# Patient Record
Sex: Female | Born: 1990 | Race: Black or African American | Hispanic: No | Marital: Single | Smoking: Former smoker
Health system: Southern US, Community
[De-identification: ages and names within clinical notes are randomized; demographics above are authoritative.]

## PROBLEM LIST (undated history)

## (undated) DIAGNOSIS — D573 Sickle-cell trait: Secondary | ICD-10-CM

---
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
## 2011-01-31 LAB — WET PREP, GENITAL
Clue Cells Wet Prep HPF POC: NONE SEEN
Yeast Wet Prep HPF POC: NONE SEEN

## 2011-01-31 LAB — POCT URINALYSIS DIP (DEVICE)
Glucose, UA: NEGATIVE
Ketones, ur: NEGATIVE
Specific Gravity, Urine: 1.015

## 2011-01-31 LAB — POCT PREGNANCY, URINE: Preg Test, Ur: NEGATIVE

## 2016-01-18 ENCOUNTER — Emergency Department (HOSPITAL_BASED_OUTPATIENT_CLINIC_OR_DEPARTMENT_OTHER)
Admission: EM | Admit: 2016-01-18 | Discharge: 2016-01-18 | Disposition: A | Discharge status: Home / Self Care | Payer: Self-pay | Attending: Emergency Medicine | Admitting: Emergency Medicine

## 2016-01-18 ENCOUNTER — Encounter (HOSPITAL_BASED_OUTPATIENT_CLINIC_OR_DEPARTMENT_OTHER): Payer: Self-pay | Admitting: Emergency Medicine

## 2016-01-18 DIAGNOSIS — J020 Streptococcal pharyngitis: Secondary | ICD-10-CM | POA: Insufficient documentation

## 2016-01-18 LAB — RAPID STREP SCREEN (MED CTR MEBANE ONLY): STREPTOCOCCUS, GROUP A SCREEN (DIRECT): POSITIVE — AB

## 2016-01-18 MED ORDER — CEPHALEXIN 500 MG PO CAPS: 500.0000 mg | ORAL_CAPSULE | Freq: Three times a day (TID) | ORAL | 0 refills | Status: DC

## 2016-01-18 NOTE — ED Provider Notes (Signed)
[facility]   CSN: [id number] Arrival date & time: 01/18/16  1656  By signing my name below, I, Phillis Haggis, attest that this documentation has been prepared under the direction and in the presence of Felicie Morn, NP-C. Electronically Signed: Phillis Haggis, ED Scribe. 01/18/16. 5:56 PM.  History   Chief Complaint Chief Complaint  Patient presents with  . Sore Throat   The history is provided by the patient. No language interpreter was used.  Sore Throat  This is a new problem. The current episode started 2 days ago. The problem occurs constantly. The problem has been gradually worsening. Treatments tried: Cold medications. The treatment provided no relief.   HPI Comments: Patricia Norton is a 25 y.o. female who presents to the Emergency Department complaining of a gradually worsening sore throat onset 2 days ago. Pt reports associated fever tmax 100 F that has since resolved. She has tried day and night gel cap cold medication for her symptoms to no relief. She denies sick contacts, chills, trouble swallowing, nausea, or vomiting, Pt is allergic to penicillin.   History reviewed. No pertinent past medical history.  There are no active problems to display for this patient.   History reviewed. No pertinent surgical history.  OB History    No data available      Home Medications    Prior to Admission medications   Not on File    Family History History reviewed. No pertinent family history.  Social History Social History  Substance Use Topics  . Smoking status: Never Smoker  . Smokeless tobacco: Never Used  . Alcohol use No     Allergies   Penicillins   Review of Systems Review of Systems  Constitutional: Positive for fever. Negative for chills.  HENT: Positive for sore throat. Negative for trouble swallowing.   Gastrointestinal: Negative for nausea and vomiting.  All other systems reviewed and are negative.    Physical  Exam Updated Vital Signs BP 119/77   Pulse 102   Temp 99 F (37.2 C) (Oral)   Resp 18   Ht 5\' 3"  (1.6 m)   Wt 155 lb (70.3 kg)   LMP 01/08/2016   SpO2 98%   BMI 27.46 kg/m   Physical Exam  Constitutional: She is oriented to person, place, and time. She appears well-developed and well-nourished.  HENT:  Head: Normocephalic and atraumatic.  Mouth/Throat: Uvula is midline and mucous membranes are normal. Oropharyngeal exudate and posterior oropharyngeal erythema present.  Eyes: Conjunctivae and EOM are normal. Pupils are equal, round, and reactive to light.  Neck: Normal range of motion. Neck supple.  Cardiovascular: Normal rate and regular rhythm.   Pulmonary/Chest: Effort normal.  Musculoskeletal: Normal range of motion.  Neurological: She is alert and oriented to person, place, and time.  Skin: Skin is warm and dry.  Psychiatric: She has a normal mood and affect. Her behavior is normal.  Nursing note and vitals reviewed.    ED Treatments / Results  DIAGNOSTIC STUDIES: Oxygen Saturation is 98% on RA, normal by my interpretation.    COORDINATION OF CARE: 5:54 PM-Discussed treatment plan which includes strep screen and Keflex with pt at bedside and pt agreed to plan.    Labs (all labs ordered are listed, but only abnormal results are displayed) Labs Reviewed  RAPID STREP SCREEN (NOT AT [facility]) - Abnormal; Notable for the following:       Result Value   Streptococcus, Group A Screen (Direct) POSITIVE (*)  All other components within normal limits    EKG  EKG Interpretation None       Radiology No results found.  Procedures Procedures (including critical care time)  Medications Ordered in ED Medications - No data to display   Initial Impression / Assessment and Plan / ED Course  I have reviewed the triage vital signs and the nursing notes.  Pertinent labs & imaging results that were available during my care of the patient were reviewed by me and  considered in my medical decision making (see chart for details).  Clinical Course      Final Clinical Impressions(s) / ED Diagnoses   Pt afebrile with tonsillar exudate, cervical lymphadenopathy, & dysphagia; diagnosis of strep. Treated in the Ed with PCN IM.  Presentation non concerning for PTA or infxn spread to soft tissue. No trismus or uvula deviation. Specific return precautions discussed. Pt able to drink water in ED without difficulty with intact air way. Recommended PCP follow up.   Final diagnoses:  None  I personally performed the services described in this documentation, which was scribed in my presence. The recorded information has been reviewed and is accurate.   New Prescriptions New Prescriptions   No medications on file     Felicie MornDavid Zynasia Burklow, NP 01/19/16 0145    Rolan BuccoMelanie Belfi, MD 01/20/16 0700

## 2016-01-18 NOTE — ED Triage Notes (Signed)
Sore throat x 2 days

## 2016-07-29 ENCOUNTER — Encounter (HOSPITAL_BASED_OUTPATIENT_CLINIC_OR_DEPARTMENT_OTHER): Payer: Self-pay

## 2016-07-29 ENCOUNTER — Emergency Department (HOSPITAL_BASED_OUTPATIENT_CLINIC_OR_DEPARTMENT_OTHER)
Admission: EM | Admit: 2016-07-29 | Discharge: 2016-07-29 | Disposition: A | Discharge status: Home / Self Care | Payer: BLUE CROSS/BLUE SHIELD | Attending: Emergency Medicine | Admitting: Emergency Medicine

## 2016-07-29 DIAGNOSIS — Z87891 Personal history of nicotine dependence: Secondary | ICD-10-CM | POA: Insufficient documentation

## 2016-07-29 DIAGNOSIS — F1290 Cannabis use, unspecified, uncomplicated: Secondary | ICD-10-CM | POA: Diagnosis not present

## 2016-07-29 DIAGNOSIS — A599 Trichomoniasis, unspecified: Secondary | ICD-10-CM | POA: Insufficient documentation

## 2016-07-29 DIAGNOSIS — R55 Syncope and collapse: Secondary | ICD-10-CM

## 2016-07-29 DIAGNOSIS — N898 Other specified noninflammatory disorders of vagina: Secondary | ICD-10-CM | POA: Diagnosis present

## 2016-07-29 HISTORY — DX: Sickle-cell trait: D57.3

## 2016-07-29 LAB — URINALYSIS, MICROSCOPIC (REFLEX)

## 2016-07-29 LAB — URINALYSIS, ROUTINE W REFLEX MICROSCOPIC
BILIRUBIN URINE: NEGATIVE
Glucose, UA: NEGATIVE mg/dL
Ketones, ur: NEGATIVE mg/dL
NITRITE: NEGATIVE
PROTEIN: NEGATIVE mg/dL
Specific Gravity, Urine: 1.014 (ref 1.005–1.030)
pH: 6 (ref 5.0–8.0)

## 2016-07-29 LAB — PREGNANCY, URINE: PREG TEST UR: NEGATIVE

## 2016-07-29 MED ORDER — METRONIDAZOLE 500 MG PO TABS
2000.0000 mg | ORAL_TABLET | Freq: Once | ORAL | Status: AC
  Administered 2016-07-29: 2000 mg via ORAL
  Filled 2016-07-29: qty 4

## 2016-07-29 MED ORDER — CEFTRIAXONE SODIUM 250 MG IJ SOLR
250.0000 mg | Freq: Once | INTRAMUSCULAR | Status: AC
  Administered 2016-07-29: 250 mg via INTRAMUSCULAR
  Filled 2016-07-29: qty 250

## 2016-07-29 MED ORDER — ONDANSETRON 4 MG PO TBDP
4.0000 mg | ORAL_TABLET | Freq: Once | ORAL | Status: AC
  Administered 2016-07-29: 4 mg via ORAL
  Filled 2016-07-29: qty 1

## 2016-07-29 MED ORDER — AZITHROMYCIN 250 MG PO TABS
1000.0000 mg | ORAL_TABLET | Freq: Once | ORAL | Status: AC
  Administered 2016-07-29: 1000 mg via ORAL
  Filled 2016-07-29: qty 4

## 2016-07-29 NOTE — ED Provider Notes (Signed)
[facility]   CSN: [id number] Arrival date & time: 07/29/16  1748  By signing my name below, I, Patricia Norton, attest that this documentation has been prepared under the direction and in the presence of [employer], PA-C. Electronically Signed: Linna Norton, Scribe. 07/29/2016. 7:55 PM.  History   Chief Complaint Chief Complaint  Patient presents with  . Dizziness    The history is provided by the patient. No language interpreter was used.     HPI Comments: Patricia Norton is a 26 y.o. female with PMHx of sickle cell trait who presents to the Emergency Department complaining of intermittent dizziness and lightheadedness beginning earlier today. She states she became "hot", dizzy and lightheaded at work this afternoon and felt better after sitting down and cooling off; she notes she went home after work, went to sleep, and was lightheaded/dizzy upon waking. Pt notes she has experienced similar symptoms intermittently for several years. She states she has been working a lot recently and not sleeping adequately. She has been eating and drinking normally. Pt reports she has also had some recent vaginal discharge. No h/o anemia. No FMHx of heart problems. She denies syncope, abdominal pain, hematochezia, heavy menstrual bleeding, chest pain, SOB, nausea, vomiting, or any other associated symptoms.  Past Medical History:  Diagnosis Date  . Sickle cell trait (HCC)     There are no active problems to display for this patient.   History reviewed. No pertinent surgical history.  OB History    No data available       Home Medications    Prior to Admission medications   Not on File    Family History No family history on file.  Social History Social History  Substance Use Topics  . Smoking status: Former Games developer  . Smokeless tobacco: Never Used  . Alcohol use Yes     Comment: occ     Allergies   Penicillins   Review of Systems Review of  Systems  Constitutional: Positive for fatigue. Negative for fever.  HENT: Negative for rhinorrhea and sore throat.   Eyes: Negative for redness.  Respiratory: Negative for cough and shortness of breath.   Cardiovascular: Negative for chest pain.  Gastrointestinal: Negative for abdominal pain, blood in stool, diarrhea, nausea and vomiting.  Genitourinary: Positive for vaginal discharge. Negative for dysuria.  Musculoskeletal: Negative for myalgias.  Skin: Negative for rash.  Neurological: Positive for dizziness and light-headedness (near-syncope). Negative for syncope and headaches.   Physical Exam Updated Vital Signs BP 116/65 (BP Location: Right Arm)   Pulse 89   Temp 98.7 F (37.1 C) (Oral)   Resp 16   Ht 5\' 5"  (1.651 m)   Wt 138 lb (62.6 kg)   LMP 07/28/2016   SpO2 100%   BMI 22.96 kg/m   Physical Exam  Constitutional: She is oriented to person, place, and time. She appears well-developed and well-nourished. No distress.  HENT:  Head: Normocephalic and atraumatic.  Mouth/Throat: Oropharynx is clear and moist.  Eyes: Conjunctivae and EOM are normal.  No conjunctival pallor  Neck: Neck supple. No tracheal deviation present.  Cardiovascular: Normal rate.   Pulmonary/Chest: Effort normal. No respiratory distress.  Musculoskeletal: Normal range of motion.  Neurological: She is alert and oriented to person, place, and time.  Skin: Skin is warm and dry.  Psychiatric: She has a normal mood and affect. Her behavior is normal.  Nursing note and vitals reviewed.   ED Treatments / Results  Labs (  all labs ordered are listed, but only abnormal results are displayed) Labs Reviewed  URINALYSIS, ROUTINE W REFLEX MICROSCOPIC - Abnormal; Notable for the following:       Result Value   APPearance CLOUDY (*)    Hgb urine dipstick TRACE (*)    Leukocytes, UA LARGE (*)    All other components within normal limits  URINALYSIS, MICROSCOPIC (REFLEX) - Abnormal; Notable for the  following:    Bacteria, UA FEW (*)    Squamous Epithelial / LPF 6-30 (*)    All other components within normal limits  PREGNANCY, URINE  RPR  HIV ANTIBODY (ROUTINE TESTING)    ED ECG REPORT   Date: 07/29/2016  Rate: 63  Rhythm: normal sinus rhythm  QRS Axis: normal  Intervals: normal  ST/T Wave abnormalities: normal  Conduction Disutrbances:none  Narrative Interpretation: No signs of preexcitation, Brugada syndrome, prolonged QT  Old EKG Reviewed: none available  I have personally reviewed the EKG tracing and agree with the computerized printout as noted.  Procedures Procedures (including critical care time)  DIAGNOSTIC STUDIES: Oxygen Saturation is 100% on RA, normal by my interpretation.    COORDINATION OF CARE: 8:03 PM Discussed treatment plan with pt at bedside and pt agreed to plan.  Medications Ordered in ED Medications - No data to display   Initial Impression / Assessment and Plan / ED Course  I have reviewed the triage vital signs and the nursing notes.  Pertinent labs & imaging results that were available during my care of the patient were reviewed by me and considered in my medical decision making (see chart for details).     Vital signs reviewed and are as follows: Vitals:   07/29/16 1808 07/29/16 2022  BP: 116/65 114/71  Pulse: 89 71  Resp: 16 18  Temp: 98.7 F (37.1 C)    Patient updated on results. Encouraged to rest and hydrate well over the next 2 days. Informed that she will be notified with a positive HIV or syphilis test. Patient encouraged to return to the emergency department with worsening symptoms, difficulty breathing, chest pain, if she passes out or has any other concerns. Encouraged PCP follow-up for general physical.  Final Clinical Impressions(s) / ED Diagnoses   Final diagnoses:  Near syncope  Trichomoniasis   Near syncopal episode: Patient is not orthostatic, not pregnant, no signs of significant anemia, normal vitals. EKG  does not show any concerning findings. Patient had a positive prodrome. She has not been sleeping well and has been working a lot. Suspect benign near syncopal episode.  Trichomoniasis: Patient treated for GC and chlamydia in addition to trichomoniasis. Also tested for HIV and syphilis.  New Prescriptions New Prescriptions   No medications on file   I personally performed the services described in this documentation, which was scribed in my presence. The recorded information has been reviewed and is accurate.    Renne CriglerJoshua Reef Achterberg, PA-C 07/29/16 2125    Lavera Guiseana Duo Liu, MD 07/30/16 1225

## 2016-07-29 NOTE — Discharge Instructions (Signed)
Please read and follow all provided instructions.  Your diagnoses today include:  1. Near syncope   2. Trichomoniasis     Tests performed today include:  Urine test - shows trichomonas  EKG - normal  Test for HIV and syphilis - pending, you'll be notified of positive results  Vital signs. See below for your results today.   Medications prescribed:   None  Take any prescribed medications only as directed.  Home care instructions:  Follow any educational materials contained in this packet.  Rest well and hydrate well over the next 48 hours.  BE VERY CAREFUL not to take multiple medicines containing Tylenol (also called acetaminophen). Doing so can lead to an overdose which can damage your liver and cause liver failure and possibly death.   Follow-up instructions: Please follow-up with your primary care provider in the next 1 week for further evaluation of your symptoms.   Return instructions:   Please return to the Emergency Department if you experience worsening symptoms.  Return if you pass out, develop chest pain or worsening shortness of breath   Please return if you have any other emergent concerns.  Additional Information:  Your vital signs today were: BP 114/71    Pulse 71    Temp 98.7 F (37.1 C) (Oral)    Resp 18    Ht 5\' 5"  (1.651 m)    Wt 62.6 kg    LMP 07/28/2016    SpO2 100%    BMI 22.96 kg/m  If your blood pressure (BP) was elevated above 135/85 this visit, please have this repeated by your doctor within one month. --------------

## 2016-07-29 NOTE — ED Notes (Signed)
Delay in EKG due to student PA in room.

## 2016-07-29 NOTE — ED Triage Notes (Signed)
c/o feeling lightheaded "my whole life"-states she came in today because the feeling happened for the first time at work yesterday-NAD-steady gait

## 2016-07-31 LAB — RPR: RPR Ser Ql: NONREACTIVE

## 2016-07-31 LAB — HIV ANTIBODY (ROUTINE TESTING W REFLEX): HIV Screen 4th Generation wRfx: NONREACTIVE

## 2016-09-27 ENCOUNTER — Encounter (HOSPITAL_BASED_OUTPATIENT_CLINIC_OR_DEPARTMENT_OTHER): Payer: Self-pay | Admitting: Physician Assistant

## 2016-09-27 ENCOUNTER — Emergency Department (HOSPITAL_BASED_OUTPATIENT_CLINIC_OR_DEPARTMENT_OTHER)
Admission: EM | Admit: 2016-09-27 | Discharge: 2016-09-27 | Disposition: A | Discharge status: Home / Self Care | Payer: BLUE CROSS/BLUE SHIELD | Attending: Emergency Medicine | Admitting: Emergency Medicine

## 2016-09-27 DIAGNOSIS — Z3202 Encounter for pregnancy test, result negative: Secondary | ICD-10-CM | POA: Diagnosis not present

## 2016-09-27 DIAGNOSIS — R630 Anorexia: Secondary | ICD-10-CM | POA: Diagnosis not present

## 2016-09-27 DIAGNOSIS — N926 Irregular menstruation, unspecified: Secondary | ICD-10-CM

## 2016-09-27 DIAGNOSIS — Z87891 Personal history of nicotine dependence: Secondary | ICD-10-CM | POA: Insufficient documentation

## 2016-09-27 LAB — PREGNANCY, URINE: PREG TEST UR: NEGATIVE

## 2016-09-27 NOTE — ED Notes (Signed)
ED Provider at bedside. 

## 2016-09-27 NOTE — ED Provider Notes (Signed)
[facility]   CSN: [id number] Arrival date & time: 09/27/16  1317     History   Chief Complaint No chief complaint on file.   HPI Patricia Norton is a 26 y.o. female who presents today with a chief complaint of decreased appetite and missed menstral cycle.  She had her last menstrual cycle in March, Says that she is normally regular and that her cycle normally starts at the end of each month. Reports that she has been having unprotected intercourse. She reports that she took a home pregnancy test last week however that it was negative.  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   She additionally reports that she has not been eating well recently, due to decreased appetite. She said that foods don't smell good and that when she smells food it "sits on my stomach wrong."  She reports occasional marijuana use, stating that recently she has been smoking less.  She reports that she is able to eat bland foods such as mashed potatoes, and macaroni.  Denies abdominal pain, nausea, vomiting. Endorses diarrhea 1-2 times per week which she says is her normal.  HPI  Past Medical History:  Diagnosis Date  . Sickle cell trait (HCC)     There are no active problems to display for this patient.   History reviewed. No pertinent surgical history.  OB History    No data available       Home Medications    Prior to Admission medications   Not on File    Family History History reviewed. No pertinent family history.  Social History Social History  Substance Use Topics  . Smoking status: Former Games developermoker  . Smokeless tobacco: Never Used  . Alcohol use Yes     Comment: occ     Allergies   Penicillins   Review of Systems Review of Systems  Constitutional: Positive for appetite change. Negative for activity change, fatigue and fever.  HENT: Negative for congestion,  sinus pressure and sore throat.   Eyes: Negative for visual disturbance.  Respiratory: Positive for cough (Dry cough, "from marajuana"). Negative for choking and chest tightness.   Cardiovascular: Negative for chest pain.  Gastrointestinal: Positive for diarrhea. Negative for abdominal pain, constipation, nausea and vomiting.  Endocrine: Negative for polyuria.  Genitourinary: Positive for menstrual problem. Negative for decreased urine volume, difficulty urinating, dysuria, frequency, hematuria, pelvic pain, urgency, vaginal bleeding, vaginal discharge and vaginal pain.  Musculoskeletal: Negative for arthralgias, back pain, myalgias, neck pain and neck stiffness.  Skin: Negative for color change and rash.  Neurological: Negative for dizziness, light-headedness, numbness and headaches.     Physical Exam Updated Vital Signs BP 114/65 (BP Location: Right Arm)   Pulse 85   Temp 98.4 F (36.9 C) (Oral)   Resp 17   Ht 5\' 5"  (1.651 m)   Wt 165 lb (74.8 kg)   LMP 08/10/2016   SpO2 98%   BMI 27.46 kg/m   Physical Exam  Constitutional: She appears well-developed and well-nourished.  HENT:  Head: Normocephalic and atraumatic.  Eyes: No scleral icterus.  Neck: Neck supple. No tracheal deviation present.  Cardiovascular: Normal rate, regular rhythm and normal heart sounds.   No murmur heard. Pulmonary/Chest: Effort normal and breath sounds normal. No respiratory distress.  Abdominal: Soft. Bowel sounds are normal. She exhibits no distension and no mass. There is no tenderness. There is  no rebound and no guarding. No hernia.  No pelvic pain  Musculoskeletal: Normal range of motion. She exhibits no deformity.  Lymphadenopathy:    She has no cervical adenopathy.  Neurological: She is alert. Gait normal.  Skin: Skin is warm and dry. No rash noted. She is not diaphoretic. No erythema. No pallor.  Psychiatric: She has a normal mood and affect. Her behavior is normal.  Nursing note and vitals  reviewed.    ED Treatments / Results  Labs (all labs ordered are listed, but only abnormal results are displayed) Labs Reviewed  PREGNANCY, URINE    EKG  EKG Interpretation None       Radiology No results found.  Procedures Procedures (including critical care time)  Medications Ordered in ED Medications - No data to display   Initial Impression / Assessment and Plan / ED Course  I have reviewed the triage vital signs and the nursing notes.  Pertinent labs & imaging results that were available during my care of the patient were reviewed by me and considered in my medical decision making (see chart for details).    Patricia Docker presents with missed menses (LMP end of March) and decreased appetite/nausea.  A urine pregnancy was negative.  She does not take birth control and reports having regular unprotected sex.  She will be advised to follow up with her PCP or OB/GYN with regard to today's complaint.  As she is not nauseous she will not be given anti-emetic. She will be given instructions to take a home pregnancy test next week, to stop smoking marijuana and on safe sex.   At this time there does not appear to be any evidence of an acute emergency medical condition and the patient appears stable for discharge with appropriate outpatient follow up.Diagnosis was discussed with patient who verbalizes understanding and is agreeable to discharge. Pt case discussed with Dr. Eudelia Bunch who agrees with my plan.    Final Clinical Impressions(s) / ED Diagnoses   Final diagnoses:  Urine pregnancy test negative  Decreased appetite  Menstrual period late    New Prescriptions New Prescriptions   No medications on file     Cristina Gong, Cordelia Poche 09/27/16 1427    Cristina Gong, PA-C 09/27/16 1434    Nira Conn, MD 09/27/16 [phone]

## 2016-09-27 NOTE — ED Triage Notes (Signed)
Patient states that she is "very late"  - she noticed about a week ago she stopped having an appetite. She reports that she took a preg test and it was negative

## 2016-09-27 NOTE — Discharge Instructions (Signed)
Today your urine pregnancy test was negative.  You need to obtain a primary care doctor and follow up with them as there are many possible reasons for your symptoms and you may need additional lab work.   Please take another home pregnancy test in one week.  Please only have protected sex.  Having unprotected sex puts you at risk of getting STI(STD) and becoming pregnant.  Please stop smoking marijuana as this may be contributing to your decreased appetite. Please attempt to eat a healthy, balanced diet.

## 2016-11-17 ENCOUNTER — Emergency Department (HOSPITAL_BASED_OUTPATIENT_CLINIC_OR_DEPARTMENT_OTHER)
Admission: EM | Admit: 2016-11-17 | Discharge: 2016-11-17 | Disposition: A | Discharge status: Home / Self Care | Payer: BLUE CROSS/BLUE SHIELD | Attending: Emergency Medicine | Admitting: Emergency Medicine

## 2016-11-17 ENCOUNTER — Encounter (HOSPITAL_BASED_OUTPATIENT_CLINIC_OR_DEPARTMENT_OTHER): Payer: Self-pay | Admitting: *Deleted

## 2016-11-17 DIAGNOSIS — Z87891 Personal history of nicotine dependence: Secondary | ICD-10-CM | POA: Diagnosis not present

## 2016-11-17 DIAGNOSIS — O26891 Other specified pregnancy related conditions, first trimester: Secondary | ICD-10-CM | POA: Insufficient documentation

## 2016-11-17 DIAGNOSIS — R112 Nausea with vomiting, unspecified: Secondary | ICD-10-CM | POA: Diagnosis present

## 2016-11-17 DIAGNOSIS — Z3A01 Less than 8 weeks gestation of pregnancy: Secondary | ICD-10-CM | POA: Diagnosis not present

## 2016-11-17 DIAGNOSIS — O219 Vomiting of pregnancy, unspecified: Secondary | ICD-10-CM | POA: Insufficient documentation

## 2016-11-17 LAB — URINALYSIS, ROUTINE W REFLEX MICROSCOPIC
BILIRUBIN URINE: NEGATIVE
Glucose, UA: NEGATIVE mg/dL
Hgb urine dipstick: NEGATIVE
KETONES UR: 15 mg/dL — AB
Leukocytes, UA: NEGATIVE
NITRITE: NEGATIVE
PROTEIN: NEGATIVE mg/dL
SPECIFIC GRAVITY, URINE: 1.017 (ref 1.005–1.030)
pH: 6 (ref 5.0–8.0)

## 2016-11-17 LAB — PREGNANCY, URINE: PREG TEST UR: POSITIVE — AB

## 2016-11-17 MED ORDER — ONDANSETRON HCL 4 MG/2ML IJ SOLN
4.0000 mg | Freq: Once | INTRAMUSCULAR | Status: AC
  Administered 2016-11-17: 4 mg via INTRAVENOUS
  Filled 2016-11-17: qty 2

## 2016-11-17 MED ORDER — SODIUM CHLORIDE 0.9 % IV BOLUS (SEPSIS)
1000.0000 mL | Freq: Once | INTRAVENOUS | Status: AC
  Administered 2016-11-17: 1000 mL via INTRAVENOUS

## 2016-11-17 MED ORDER — PROMETHAZINE HCL 25 MG PO TABS: 25.0000 mg | ORAL_TABLET | Freq: Four times a day (QID) | ORAL | 0 refills | Status: AC | PRN

## 2016-11-17 MED FILL — PROMETHAZINE 25 MG TABLET: 25 | 3 days supply | Qty: 12 | Fill #0

## 2016-11-17 NOTE — ED Notes (Signed)
ED Provider at bedside. 

## 2016-11-17 NOTE — ED Provider Notes (Signed)
[facility] Provider Note   CSN: [id number] Arrival date & time: 11/17/16  1345   By signing my name below, I, Cynda AcresHailei Fulton, attest that this documentation has been prepared under the direction and in the presence of Rolan BuccoBelfi, Jahlisa Rossitto, MD. Electronically Signed: Cynda AcresHailei Fulton, Scribe. 11/17/16. 3:13 PM.  History   Chief Complaint Chief Complaint  Patient presents with  . Morning Sickness    HPI Comments: Patricia Norton is a 26 y.o. female with no pertinent past medical history, who presents to the Emergency Department complaining of sudden-onset, persistent vomiting that began two weeks ago. Patient states she has been intermittently vomiting during the evening for the past several weeks. Patient was seen at urgent care two weeks ago, her workup revealed a positive pregnancy test. Patient's last normal menstrual period was was 09/25/16,  [id number]. Patient reports associated fatigue, which she experiences in the morning. No medications taken prior to arrival. Patient denies any vaginal discharge, vaginal bleeding, abdominal pain, fever, chills, or cough.   The history is provided by the patient. No language interpreter was used.    Past Medical History:  Diagnosis Date  . Sickle cell trait (HCC)     There are no active problems to display for this patient.   History reviewed. No pertinent surgical history.  OB History    No data available       Home Medications    Prior to Admission medications   Medication Sig Start Date End Date Taking? Authorizing Provider  promethazine (PHENERGAN) 25 MG tablet Take 1 tablet (25 mg total) by mouth every 6 (six) hours as needed for nausea or vomiting. 11/17/16   Rolan BuccoBelfi, Nilay Mangrum, MD    Family History History reviewed. No pertinent family history.  Social History Social History  Substance Use Topics  . Smoking status: Former Games developermoker  . Smokeless tobacco: Never Used  . Alcohol use Yes     Comment: occ     Allergies     Penicillins   Review of Systems Review of Systems  Constitutional: Negative for chills, diaphoresis, fatigue and fever.  HENT: Negative for congestion, rhinorrhea and sneezing.   Eyes: Negative.   Respiratory: Negative for cough, chest tightness and shortness of breath.   Cardiovascular: Negative for chest pain and leg swelling.  Gastrointestinal: Positive for nausea and vomiting. Negative for abdominal pain, blood in stool and diarrhea.  Genitourinary: Negative for difficulty urinating, flank pain, frequency, hematuria, vaginal bleeding and vaginal discharge.  Musculoskeletal: Negative for arthralgias and back pain.  Skin: Negative for rash.  Neurological: Negative for dizziness, speech difficulty, weakness, numbness and headaches.  All other systems reviewed and are negative.    Physical Exam Updated Vital Signs BP 124/65 (BP Location: Left Arm)   Pulse 66   Temp 98.5 F (36.9 C) (Oral)   Resp 16   Ht 5\' 3"  (1.6 m)   Wt 58.1 kg (128 lb)   LMP 09/25/2016   SpO2 100%   BMI 22.67 kg/m   Physical Exam  Constitutional: She is oriented to person, place, and time. She appears well-developed and well-nourished.  HENT:  Head: Normocephalic and atraumatic.  Eyes: Pupils are equal, round, and reactive to light.  Neck: Normal range of motion. Neck supple.  Cardiovascular: Normal rate, regular rhythm and normal heart sounds.   Pulmonary/Chest: Effort normal and breath sounds normal. No respiratory distress. She has no wheezes. She has no rales. She exhibits no tenderness.  Abdominal: Soft. Bowel sounds are normal. There is  no tenderness. There is no rebound and no guarding.  Musculoskeletal: Normal range of motion. She exhibits no edema.  Lymphadenopathy:    She has no cervical adenopathy.  Neurological: She is alert and oriented to person, place, and time.  Skin: Skin is warm and dry. No rash noted.  Psychiatric: She has a normal mood and affect.     ED Treatments / Results   DIAGNOSTIC STUDIES: Oxygen Saturation is 100% on RA, normal by my interpretation.    COORDINATION OF CARE: 3:11 PM Discussed treatment plan with pt at bedside and pt agreed to plan, which includes IV fluids and nausea medication.   Labs (all labs ordered are listed, but only abnormal results are displayed) Labs Reviewed  URINALYSIS, ROUTINE W REFLEX MICROSCOPIC - Abnormal; Notable for the following:       Result Value   APPearance CLOUDY (*)    Ketones, ur 15 (*)    All other components within normal limits  PREGNANCY, URINE - Abnormal; Notable for the following:    Preg Test, Ur POSITIVE (*)    All other components within normal limits    EKG  EKG Interpretation None       Radiology No results found.  Procedures Procedures (including critical care time)  Medications Ordered in ED Medications  sodium chloride 0.9 % bolus 1,000 mL (0 mLs Intravenous Stopped 11/17/16 1619)  ondansetron (ZOFRAN) injection 4 mg (4 mg Intravenous Given 11/17/16 1521)     Initial Impression / Assessment and Plan / ED Course  I have reviewed the triage vital signs and the nursing notes.  Pertinent labs & imaging results that were available during my care of the patient were reviewed by me and considered in my medical decision making (see chart for details).     Pt feels much better after fluids/zofran.  No vomiting in ED.  No abd pain.  No vag bleeding.  Will d/c.  Given symptomatic care instructions.  Advised to f/u with her ob/gyn in [facility].  Final Clinical Impressions(s) / ED Diagnoses   Final diagnoses:  Vomiting during pregnancy    New Prescriptions New Prescriptions   PROMETHAZINE (PHENERGAN) 25 MG TABLET    Take 1 tablet (25 mg total) by mouth every 6 (six) hours as needed for nausea or vomiting.   I personally performed the services described in this documentation, which was scribed in my presence.  The recorded information has been reviewed and considered.     Rolan Bucco, MD 11/17/16 [phone]

## 2016-11-17 NOTE — ED Triage Notes (Signed)
Pt c/o vomiting ,LMP may 21 st seen at UC 2 weeks ago with positive preg, appt OB GYN 7/17

## 2016-12-30 ENCOUNTER — Encounter (HOSPITAL_BASED_OUTPATIENT_CLINIC_OR_DEPARTMENT_OTHER): Payer: Self-pay | Admitting: *Deleted

## 2016-12-30 ENCOUNTER — Emergency Department (HOSPITAL_BASED_OUTPATIENT_CLINIC_OR_DEPARTMENT_OTHER)
Admission: EM | Admit: 2016-12-30 | Discharge: 2016-12-31 | Disposition: A | Discharge status: Home / Self Care | Payer: BLUE CROSS/BLUE SHIELD | Attending: Emergency Medicine | Admitting: Emergency Medicine

## 2016-12-30 DIAGNOSIS — R8290 Unspecified abnormal findings in urine: Secondary | ICD-10-CM | POA: Diagnosis not present

## 2016-12-30 DIAGNOSIS — Z87891 Personal history of nicotine dependence: Secondary | ICD-10-CM | POA: Insufficient documentation

## 2016-12-30 DIAGNOSIS — R55 Syncope and collapse: Secondary | ICD-10-CM | POA: Insufficient documentation

## 2016-12-30 DIAGNOSIS — Z3A00 Weeks of gestation of pregnancy not specified: Secondary | ICD-10-CM | POA: Insufficient documentation

## 2016-12-30 LAB — URINALYSIS, MICROSCOPIC (REFLEX): RBC / HPF: NONE SEEN RBC/hpf (ref 0–5)

## 2016-12-30 LAB — CBC
HEMATOCRIT: 30.3 % — AB (ref 36.0–46.0)
HEMOGLOBIN: 10.7 g/dL — AB (ref 12.0–15.0)
MCH: 30.8 pg (ref 26.0–34.0)
MCHC: 35.3 g/dL (ref 30.0–36.0)
MCV: 87.3 fL (ref 78.0–100.0)
Platelets: 375 10*3/uL (ref 150–400)
RBC: 3.47 MIL/uL — ABNORMAL LOW (ref 3.87–5.11)
RDW: 11.7 % (ref 11.5–15.5)
WBC: 6.6 10*3/uL (ref 4.0–10.5)

## 2016-12-30 LAB — BASIC METABOLIC PANEL
ANION GAP: 11 (ref 5–15)
BUN: 10 mg/dL (ref 6–20)
CHLORIDE: 102 mmol/L (ref 101–111)
CO2: 22 mmol/L (ref 22–32)
Calcium: 8.8 mg/dL — ABNORMAL LOW (ref 8.9–10.3)
Creatinine, Ser: 0.59 mg/dL (ref 0.44–1.00)
GFR calc Af Amer: >60 mL/min (ref >60)
GLUCOSE: 80 mg/dL (ref 65–99)
POTASSIUM: 3.4 mmol/L — AB (ref 3.5–5.1)
Sodium: 135 mmol/L (ref 135–145)

## 2016-12-30 LAB — URINALYSIS, ROUTINE W REFLEX MICROSCOPIC
BILIRUBIN URINE: NEGATIVE
GLUCOSE, UA: NEGATIVE mg/dL
Hgb urine dipstick: NEGATIVE
Ketones, ur: >80 mg/dL — AB
LEUKOCYTES UA: NEGATIVE
NITRITE: POSITIVE — AB
PH: 5 (ref 5.0–8.0)
Protein, ur: NEGATIVE mg/dL
SPECIFIC GRAVITY, URINE: 1.018 (ref 1.005–1.030)

## 2016-12-30 LAB — CBG MONITORING, ED: Glucose-Capillary: 75 mg/dL (ref 65–99)

## 2016-12-30 MED ORDER — METOCLOPRAMIDE HCL 5 MG/ML IJ SOLN
10.0000 mg | Freq: Once | INTRAMUSCULAR | Status: AC
  Administered 2016-12-30: 10 mg via INTRAVENOUS
  Filled 2016-12-30: qty 2

## 2016-12-30 MED ORDER — DIPHENHYDRAMINE HCL 50 MG/ML IJ SOLN
25.0000 mg | Freq: Once | INTRAMUSCULAR | Status: AC
  Administered 2016-12-30: 25 mg via INTRAVENOUS
  Filled 2016-12-30: qty 1

## 2016-12-30 MED ORDER — SODIUM CHLORIDE 0.9 % IV BOLUS (SEPSIS)
1000.0000 mL | Freq: Once | INTRAVENOUS | Status: AC
  Administered 2016-12-30: 1000 mL via INTRAVENOUS

## 2016-12-30 NOTE — ED Provider Notes (Signed)
[facility] Provider Note: Lowella Dell, MD, FACEP  CSN: [id number] MRN: [id number] ARRIVAL: 12/30/16 at 2151 ROOM: MH11/MH11   CHIEF COMPLAINT  Syncope   HISTORY OF PRESENT ILLNESS  12/30/16 11:22 PM Patricia Norton is a 26 y.o. female who is about 3 months pregnant. She has been receiving prenatal care. She has had 2 syncopal episodes recently. The first was 3 days ago the second was this evening about 8 PM. Her syncopal episodes have both been preceded by a sensation of feeling hot, lightheadedness and blurred vision for about 10 minutes. She did not injure herself either time as she was caught by a Radio broadcast assistant. The syncopal episodes have been followed by a frontal headache which she rates as a 9 out of 10. She has also had lightheadedness and blurred vision in the mornings before she vomits from morning sickness but she has not had syncopal episodes associated with that. She denies nausea or vomiting at the present time. She denies chest pain, shortness of breath, palpitations, blurred vision presently, photophobia, focal neurologic deficits, abdominal pain, vaginal bleeding or vaginal discharge.   Past Medical History:  Diagnosis Date  . Sickle cell trait (HCC)     History reviewed. No pertinent surgical history.  History reviewed. No pertinent family history.  Social History  Substance Use Topics  . Smoking status: Former Games developer  . Smokeless tobacco: Never Used  . Alcohol use Yes     Comment: occ    Prior to Admission medications   Medication Sig Start Date End Date Taking? Authorizing Provider  promethazine (PHENERGAN) 25 MG tablet Take 1 tablet (25 mg total) by mouth every 6 (six) hours as needed for nausea or vomiting. 11/17/16   Rolan Bucco, MD    Allergies Penicillins   REVIEW OF SYSTEMS  Negative except as noted here or in the History of Present Illness.   PHYSICAL EXAMINATION  Initial Vital Signs Blood pressure 118/80, pulse 81, temperature  98.9 F (37.2 C), temperature source Oral, resp. rate 18, height 5\' 5"  (1.651 m), weight 57.7 kg (127 lb 3.3 oz), last menstrual period 09/17/2016, SpO2 100 %.  Examination General: Well-developed, well-nourished female in no acute distress; appearance consistent with age of record HENT: normocephalic; atraumatic Eyes: pupils equal, round and reactive to light; extraocular muscles intact Neck: supple Heart: regular rate and rhythm Lungs: clear to auscultation bilaterally Abdomen: soft; nondistended; nontender; no masses or hepatosplenomegaly; bowel sounds present Extremities: No deformity; full range of motion; pulses normal Neurologic: Awake, alert and oriented; motor function intact in all extremities and symmetric; no facial droop Skin: Warm and dry Psychiatric: Normal mood and affect   RESULTS  Summary of this visit's results, reviewed by myself:   EKG Interpretation  Date/Time:  Tuesday December 30 2016 22:30:36 EDT Ventricular Rate:  76 PR Interval:    QRS Duration: 78 QT Interval:  389 QTC Calculation: 438 R Axis:   60 Text Interpretation:  Sinus rhythm Normal ECG No previous ECGs available Confirmed by Deitra Craine, Jonny Ruiz ([id number]) on 12/30/2016 10:54:44 PM      Laboratory Studies: Results for orders placed or performed during the hospital encounter of 12/30/16 (from the past 24 hour(s))  Basic metabolic panel     Status: Abnormal   Collection Time: 12/30/16 10:24 PM  Result Value Ref Range   Sodium 135 135 - 145 mmol/L   Potassium 3.4 (L) 3.5 - 5.1 mmol/L   Chloride 102 101 - 111 mmol/L   CO2 22 22 -  32 mmol/L   Glucose, Bld 80 65 - 99 mg/dL   BUN 10 6 - 20 mg/dL   Creatinine, Ser 4.09 0.44 - 1.00 mg/dL   Calcium 8.8 (L) 8.9 - 10.3 mg/dL   GFR calc non Af Amer >60 >60 mL/min   GFR calc Af Amer >60 >60 mL/min   Anion gap 11 5 - 15  CBC     Status: Abnormal   Collection Time: 12/30/16 10:24 PM  Result Value Ref Range   WBC 6.6 4.0 - 10.5 K/uL   RBC 3.47 (L) 3.87 - 5.11  MIL/uL   Hemoglobin 10.7 (L) 12.0 - 15.0 g/dL   HCT 81.1 (L) 91.4 - 78.2 %   MCV 87.3 78.0 - 100.0 fL   MCH 30.8 26.0 - 34.0 pg   MCHC 35.3 30.0 - 36.0 g/dL   RDW 95.6 21.3 - 08.6 %   Platelets 375 150 - 400 K/uL  Urinalysis, Routine w reflex microscopic     Status: Abnormal   Collection Time: 12/30/16 10:24 PM  Result Value Ref Range   Color, Urine YELLOW YELLOW   APPearance CLOUDY (A) CLEAR   Specific Gravity, Urine 1.018 1.005 - 1.030   pH 5.0 5.0 - 8.0   Glucose, UA NEGATIVE NEGATIVE mg/dL   Hgb urine dipstick NEGATIVE NEGATIVE   Bilirubin Urine NEGATIVE NEGATIVE   Ketones, ur >80 (A) NEGATIVE mg/dL   Protein, ur NEGATIVE NEGATIVE mg/dL   Nitrite POSITIVE (A) NEGATIVE   Leukocytes, UA NEGATIVE NEGATIVE  Urinalysis, Microscopic (reflex)     Status: Abnormal   Collection Time: 12/30/16 10:24 PM  Result Value Ref Range   RBC / HPF NONE SEEN 0 - 5 RBC/hpf   WBC, UA 0-5 0 - 5 WBC/hpf   Bacteria, UA MANY (A) NONE SEEN   Squamous Epithelial / LPF 0-5 (A) NONE SEEN   Mucus PRESENT    Hyaline Casts, UA PRESENT    WBC Casts, UA PRESENT   CBG monitoring, ED     Status: None   Collection Time: 12/30/16 10:41 PM  Result Value Ref Range   Glucose-Capillary 75 65 - 99 mg/dL   Imaging Studies: No results found.  ED COURSE  Nursing notes and initial vitals signs, including pulse oximetry, reviewed.  Vitals:   12/30/16 2158  BP: 118/80  Pulse: 81  Resp: 18  Temp: 98.9 F (37.2 C)  TempSrc: Oral  SpO2: 100%  Weight: 57.7 kg (127 lb 3.3 oz)  Height: 5\' 5"  (1.651 m)   12:31 AM Patient feeling better after IV fluids and medications. Her orthostatics are unremarkable. Urinalysis is not diagnostic of urinary tract infection but has been sent for culture. We will have her follow-up with her OB/GYN for further evaluation. Monitor strip reviewed. No ectopy or other arrhythmias seen.  PROCEDURES    ED DIAGNOSES     ICD-10-CM   1. Syncope and collapse R55        Amaziah Ghosh,  Jonny Ruiz, MD 12/31/16 [phone]

## 2016-12-30 NOTE — ED Triage Notes (Addendum)
Pt c/o  Witnessed syncopal episode at work , 3 months preg . Pt also c/o dizziness and h/a

## 2017-01-02 LAB — URINE CULTURE: Culture: >=100000 — AB

## 2017-01-03 ENCOUNTER — Telehealth: Payer: Self-pay

## 2017-01-03 NOTE — Telephone Encounter (Signed)
Post ED Visit - Positive Culture Follow-up: Unsuccessful Patient Follow-up  Culture assessed and recommendations reviewed by:  []  Enzo Bi, Pharm.D. []  Celedonio Miyamoto, Pharm.D., BCPS AQ-ID [x]  Garvin Fila, Pharm.D., BCPS []  Georgina Pillion, [address].D., BCPS []  [place], [address].D., BCPS, AAHIVP []  Estella Husk, Pharm.D., BCPS, AAHIVP []  Lysle Pearl, PharmD, BCPS []  Casilda Carls, PharmD, BCPS []  Pollyann Samples, PharmD, BCPS  Positive urine culture  [x]  Patient discharged without antimicrobial prescription and treatment is now indicated []  Organism is resistant to prescribed ED discharge antimicrobial []  Patient with positive blood cultures   Unable to contact patient after 3 attempts, letter will be sent to address on file  Jerry Caras 01/03/2017, 10:42 AM

## 2017-01-03 NOTE — Progress Notes (Signed)
ED Antimicrobial Stewardship Positive Culture Follow Up   Patricia Norton is an 26 y.o. female who presented to [facility] on 12/30/2016 with a chief complaint of  Chief Complaint  Patient presents with  . Syncope    Recent Results (from the past 720 hour(s))  Urine culture     Status: Abnormal   Collection Time: 12/30/16 10:24 PM  Result Value Ref Range Status   Specimen Description URINE, RANDOM  Final   Special Requests NONE  Final   Culture >=100,000 COLONIES/mL ESCHERICHIA COLI (A)  Final   Report Status 01/02/2017 FINAL  Final   Organism ID, Bacteria ESCHERICHIA COLI (A)  Final      Susceptibility   Escherichia coli - MIC*    AMPICILLIN >=32 RESISTANT Resistant     CEFAZOLIN <=4 SENSITIVE Sensitive     CEFTRIAXONE <=1 SENSITIVE Sensitive     CIPROFLOXACIN 1 SENSITIVE Sensitive     GENTAMICIN <=1 SENSITIVE Sensitive     IMIPENEM <=0.25 SENSITIVE Sensitive     NITROFURANTOIN <=16 SENSITIVE Sensitive     TRIMETH/SULFA <=20 SENSITIVE Sensitive     AMPICILLIN/SULBACTAM 16 INTERMEDIATE Intermediate     PIP/TAZO <=4 SENSITIVE Sensitive     Extended ESBL NEGATIVE Sensitive     * >=100,000 COLONIES/mL ESCHERICHIA COLI   [x]  Patient discharged originally without antimicrobial agent and treatment is now indicated  New antibiotic prescription: Keflex 500 mg BID x 1 week  ED Provider: Frederik Pear, PA-C  Bertram Millard 01/03/2017, 9:33 AM Infectious Diseases Pharmacist Phone# [phone]

## 2017-01-10 NOTE — Telephone Encounter (Signed)
Post ED Visit - Positive Culture Follow-up: Successful Patient Follow-Up  Culture assessed and recommendations reviewed by: []  Enzo BiNathan Batchelder, Pharm.D. []  Celedonio MiyamotoJeremy Frens, Pharm.D., BCPS AQ-ID [x]  Garvin FilaMike Maccia, Pharm.D., BCPS []  Georgina PillionElizabeth Martin, [address]D., BCPS []  [place]ellytownMinh Pham, [address]D., BCPS, AAHIVP []  Estella HuskMichelle Turner, Pharm.D., BCPS, AAHIVP []  Lysle Pearlachel Rumbarger, PharmD, BCPS []  Casilda Carlsaylor Stone, PharmD, BCPS []  Pollyann SamplesAndy Johnston, PharmD, BCPS  Positive urine culture  [x]  Patient discharged without antimicrobial prescription and treatment is now indicated []  Organism is resistant to prescribed ED discharge antimicrobial []  Patient with positive blood cultures  Changes discussed with ED provider: Frederik PearMia McDonald PA-C New antibiotic prescription Keflex 500 mg PO BID x 7 days [phone]  Contacted patient, date 01/10/17, time 1803   Henry Utsey, Linnell FullingRose Burnett 01/10/2017, 6:00 PM

## 2017-02-12 ENCOUNTER — Encounter (HOSPITAL_BASED_OUTPATIENT_CLINIC_OR_DEPARTMENT_OTHER): Payer: Self-pay | Admitting: *Deleted

## 2017-02-12 ENCOUNTER — Emergency Department (HOSPITAL_BASED_OUTPATIENT_CLINIC_OR_DEPARTMENT_OTHER)
Admission: EM | Admit: 2017-02-12 | Discharge: 2017-02-13 | Disposition: A | Discharge status: Home / Self Care | Payer: BLUE CROSS/BLUE SHIELD | Attending: Emergency Medicine | Admitting: Emergency Medicine

## 2017-02-12 DIAGNOSIS — Z87891 Personal history of nicotine dependence: Secondary | ICD-10-CM | POA: Diagnosis not present

## 2017-02-12 DIAGNOSIS — Z3A19 19 weeks gestation of pregnancy: Secondary | ICD-10-CM | POA: Diagnosis not present

## 2017-02-12 DIAGNOSIS — O9989 Other specified diseases and conditions complicating pregnancy, childbirth and the puerperium: Secondary | ICD-10-CM | POA: Insufficient documentation

## 2017-02-12 DIAGNOSIS — H9202 Otalgia, left ear: Secondary | ICD-10-CM | POA: Diagnosis not present

## 2017-02-12 NOTE — ED Provider Notes (Signed)
[facility]   CSN: [id number] Arrival date & time: 02/12/17  2109     History   Chief Complaint Chief Complaint  Patient presents with  . Otalgia    HPI Patricia Norton is a 26 y.o. female who is [redacted] weeks pregnant who presents to the department today for left ear pain x 4 days after q-tip use. The irritation has been constant since. She feels there may be a scab in the ear from using Q-tips but did not note blood on the q-tip after use. She has not taken anything for this. She denies fever, chills, congestion, sore throat, neck pain, ear drainage or tinnitus.   HPI  Past Medical History:  Diagnosis Date  . Sickle cell trait (HCC)     There are no active problems to display for this patient.   History reviewed. No pertinent surgical history.  OB History    Gravida Para Term Preterm AB Living   1             SAB TAB Ectopic Multiple Live Births                   Home Medications    Prior to Admission medications   Medication Sig Start Date End Date Taking? Authorizing Provider  neomycin-polymyxin-hydrocortisone (CORTISPORIN) 3.5-10000-1 OTIC suspension Place 3 drops into the left ear 4 (four) times daily. 02/13/17   Eydan Chianese, Elmer Sow, PA-C  promethazine (PHENERGAN) 25 MG tablet Take 1 tablet (25 mg total) by mouth every 6 (six) hours as needed for nausea or vomiting. 11/17/16   Rolan Bucco, MD    Family History No family history on file.  Social History Social History  Substance Use Topics  . Smoking status: Former Games developer  . Smokeless tobacco: Never Used  . Alcohol use Yes     Comment: occ     Allergies   Penicillins   Review of Systems Review of Systems  Constitutional: Negative for chills and fever.  HENT: Positive for ear pain. Negative for ear discharge, facial swelling, hearing loss, rhinorrhea, sinus pain, sinus pressure, sore throat, tinnitus and trouble swallowing.   Eyes: Negative for pain.     Physical  Exam Updated Vital Signs BP 111/65 (BP Location: Right Arm)   Pulse 87   Temp 98.3 F (36.8 C) (Oral)   Resp 18   Ht  (1.626 m)   Wt 73.5 kg (162 lb)   LMP 09/25/2016   SpO2 99%   BMI 27.81 kg/m   Physical Exam  Constitutional: She appears well-developed and well-nourished. No distress.  HENT:  Head: Normocephalic and atraumatic.  Right Ear: Hearing, tympanic membrane, external ear and ear canal normal. No foreign bodies. No mastoid tenderness. Tympanic membrane is not injected, not perforated, not erythematous, not retracted and not bulging.  Left Ear: Hearing, tympanic membrane and external ear normal. No foreign bodies. No mastoid tenderness. Tympanic membrane is not injected, not perforated, not erythematous, not retracted and not bulging.  Nose: Nose normal. No mucosal edema, rhinorrhea, sinus tenderness, septal deviation or nasal septal hematoma.  No foreign bodies. Right sinus exhibits no maxillary sinus tenderness and no frontal sinus tenderness. Left sinus exhibits no maxillary sinus tenderness and no frontal sinus tenderness.  Mouth/Throat: Uvula is midline, oropharynx is clear and moist and mucous membranes are normal. No tonsillar exudate.  Left ear canal with redness and irritation. No FB  Eyes: Pupils are equal, round, and reactive to light. Conjunctivae, EOM  and lids are normal. Right eye exhibits no discharge. Left eye exhibits no discharge. Right conjunctiva is not injected. Left conjunctiva is not injected.  Neck: Trachea normal, normal range of motion, full passive range of motion without pain and phonation normal. Neck supple. No spinous process tenderness and no muscular tenderness present. No neck rigidity. No tracheal deviation and normal range of motion present.  Cardiovascular: Normal rate and regular rhythm.   Pulmonary/Chest: Effort normal and breath sounds normal. No stridor. She has no wheezes.  Abdominal: Soft.  Lymphadenopathy:       Head (right side):  No submental, no submandibular, no tonsillar, no preauricular, no posterior auricular and no occipital adenopathy present.       Head (left side): No submental, no submandibular, no tonsillar, no preauricular, no posterior auricular and no occipital adenopathy present.    She has no cervical adenopathy.  Neurological: She is alert.  Skin: Skin is warm and dry. No rash noted.  Psychiatric: She has a normal mood and affect.  Nursing note and vitals reviewed.    ED Treatments / Results  Labs (all labs ordered are listed, but only abnormal results are displayed) Labs Reviewed - No data to display  EKG  EKG Interpretation None       Radiology No results found.  Procedures Procedures (including critical care time)  Medications Ordered in ED Medications - No data to display   Initial Impression / Assessment and Plan / ED Course  I have reviewed the triage vital signs and the nursing notes.  Pertinent labs & imaging results that were available during my care of the patient were reviewed by me and considered in my medical decision making (see chart for details).     Pt presenting with ear canal redness/irritation after q-tip use. Noted canal irritation on exam. TM is intact. No canal occlusion. No signs of other illness. Pt afebrile in NAD. Exam non concerning for mastoiditis, cellulitis or malignant OE. Discussed use of medication with Dr. Preston Fleeting who is in  agreement with Cortisporin suspension. Advised patient to not stick anything in her ear smaller then her elbow. She is to follow up this week with PCP or OBGYN. Return precautions given. Patient in agreement with plan.   Final Clinical Impressions(s) / ED Diagnoses   Final diagnoses:  Otalgia of left ear    New Prescriptions New Prescriptions   NEOMYCIN-POLYMYXIN-HYDROCORTISONE (CORTISPORIN) 3.5-10000-1 OTIC SUSPENSION    Place 3 drops into the left ear 4 (four) times daily.     Jacinto Halim, PA-C 02/13/17 0033     Dione Booze, MD 02/13/17 [phone]

## 2017-02-12 NOTE — ED Triage Notes (Addendum)
Pain in her left ear x 4 days. She did not take Tylenol or Motrin for the pain. She is [redacted] weeks pregnant.

## 2017-02-13 MED ORDER — NEOMYCIN-POLYMYXIN-HC 3.5-10000-1 OT SUSP: 3.0000 [drp] | Freq: Four times a day (QID) | OTIC | 0 refills | Status: AC

## 2017-02-13 NOTE — Discharge Instructions (Signed)
Please read and follow all provided instructions.  You were seen here today for left ear pain. I am prescribing you ear drops.   Home Instructions: Please do not insert anything into your ear that is smaller than your elbow.  This includes QTips, keys, hair pins, etc.   Follow-up instructions: Please follow-up with your primary care provider for further evaluation of symptoms and treatment.   Additional Information:  Your vital signs today were: BP 111/65 (BP Location: Right Arm)    Pulse 87    Temp 98.3 F (36.8 C) (Oral)    Resp 18    Ht  (1.626 m)    Wt 73.5 kg (162 lb)    LMP 09/25/2016    SpO2 99%    BMI 27.81 kg/m  If your blood pressure (BP) was elevated above 135/85 this visit, please have this repeated by your doctor within one month. ---------------

## 2017-06-08 ENCOUNTER — Emergency Department (HOSPITAL_BASED_OUTPATIENT_CLINIC_OR_DEPARTMENT_OTHER)
Admission: EM | Admit: 2017-06-08 | Discharge: 2017-06-09 | Disposition: A | Discharge status: Home / Self Care | Payer: BLUE CROSS/BLUE SHIELD | Attending: Emergency Medicine | Admitting: Emergency Medicine

## 2017-06-08 ENCOUNTER — Encounter (HOSPITAL_BASED_OUTPATIENT_CLINIC_OR_DEPARTMENT_OTHER): Payer: Self-pay | Admitting: Emergency Medicine

## 2017-06-08 ENCOUNTER — Other Ambulatory Visit: Payer: Self-pay

## 2017-06-08 DIAGNOSIS — O9989 Other specified diseases and conditions complicating pregnancy, childbirth and the puerperium: Secondary | ICD-10-CM | POA: Diagnosis not present

## 2017-06-08 DIAGNOSIS — R197 Diarrhea, unspecified: Secondary | ICD-10-CM | POA: Diagnosis not present

## 2017-06-08 DIAGNOSIS — R1084 Generalized abdominal pain: Secondary | ICD-10-CM

## 2017-06-08 DIAGNOSIS — Z3A35 35 weeks gestation of pregnancy: Secondary | ICD-10-CM | POA: Diagnosis not present

## 2017-06-08 DIAGNOSIS — Z87891 Personal history of nicotine dependence: Secondary | ICD-10-CM | POA: Insufficient documentation

## 2017-06-08 DIAGNOSIS — O218 Other vomiting complicating pregnancy: Secondary | ICD-10-CM | POA: Insufficient documentation

## 2017-06-08 DIAGNOSIS — R1110 Vomiting, unspecified: Secondary | ICD-10-CM

## 2017-06-08 MED ORDER — SODIUM CHLORIDE 0.9 % IV BOLUS (SEPSIS)
1000.0000 mL | Freq: Once | INTRAVENOUS | Status: AC
  Administered 2017-06-09: 1000 mL via INTRAVENOUS

## 2017-06-08 NOTE — ED Notes (Signed)
Spoke with kristy at [facility] who is aware that pt has been placed on the monitor.

## 2017-06-08 NOTE — ED Provider Notes (Signed)
[facility] EMERGENCY DEPARTMENT Provider Note   CSN: [id number] Arrival date & time: 06/08/17  2256     History   Chief Complaint Chief Complaint  Patient presents with  . abdominal pain 8 months pregnant    HPI Patricia Norton is a 27 y.o. female.  The history is provided by the patient.  Abdominal Pain   This is a new problem. The current episode started more than 2 days ago. The problem occurs daily. The problem has been resolved. Associated with: pregnancy. The pain is located in the generalized abdominal region. The pain is moderate. Associated symptoms include diarrhea and vomiting. Pertinent negatives include fever and headaches. Nothing aggravates the symptoms. Nothing relieves the symptoms.   Patient is currently [redacted] weeks pregnant presented with abdominal pain. She denies any complications with this pregnancy. She had one episode of vomiting/one episode of diarrhea this morning that was nonbloody. She denies vaginal bleeding or loss of fluid.  She reports good fetal movement.  She denies trauma. She has had intermittent episodes of sharp pain, none at this time. Her OB/GYN is Dr. Allena Katz at [facility] Past Medical History:  Diagnosis Date  . Sickle cell trait (HCC)     There are no active problems to display for this patient.   History reviewed. No pertinent surgical history.  OB History    Gravida Para Term Preterm AB Living   1             SAB TAB Ectopic Multiple Live Births                   Home Medications    Prior to Admission medications   Medication Sig Start Date End Date Taking? Authorizing Provider  neomycin-polymyxin-hydrocortisone (CORTISPORIN) 3.5-10000-1 OTIC suspension Place 3 drops into the left ear 4 (four) times daily. 02/13/17   Maczis, Elmer Sow, PA-C  promethazine (PHENERGAN) 25 MG tablet Take 1 tablet (25 mg total) by mouth every 6 (six) hours as needed for nausea or vomiting. 11/17/16   Rolan Bucco, MD    Family  History No family history on file.  Social History Social History   Tobacco Use  . Smoking status: Former Games developer  . Smokeless tobacco: Never Used  Substance Use Topics  . Alcohol use: Yes    Comment: occ  . Drug use: Yes     Allergies   Penicillins   Review of Systems Review of Systems  Constitutional: Negative for fever.  Gastrointestinal: Positive for abdominal pain, diarrhea and vomiting. Negative for blood in stool.  Genitourinary: Negative for vaginal bleeding and vaginal discharge.  Neurological: Negative for headaches.  All other systems reviewed and are negative.    Physical Exam Updated Vital Signs BP 135/85 (BP Location: Left Arm)   Pulse 83   Temp 97.8 F (36.6 C) (Oral)   Resp 18   LMP 09/25/2016   SpO2 100%   Physical Exam CONSTITUTIONAL: Well developed/well nourished HEAD: Normocephalic/atraumatic EYES: EOMI/PERRL ENMT: Mucous membranes moist NECK: supple no meningeal signs SPINE/BACK:entire spine nontender CV: S1/S2 noted, no murmurs/rubs/gallops noted LUNGS: Lungs are clear to auscultation bilaterally, no apparent distress ABDOMEN: soft, nontender, no rebound or guarding, bowel sounds noted throughout abdomen, gravid GU:no cva tenderness Nurse Tresa Endo present for exam -patient is dilated to fingertip, no fluid, no vaginal bleeding, no fetal parts noted NEURO: Pt is awake/alert/appropriate, moves all extremitiesx4.  No facial droop.   EXTREMITIES: pulses normal/equal, full ROM, no lower extremity edema SKIN: warm, color  normal PSYCH: no abnormalities of mood noted, alert and oriented to situation   ED Treatments / Results  Labs (all labs ordered are listed, but only abnormal results are displayed) Labs Reviewed  CBC - Abnormal; Notable for the following components:      Result Value   RBC 3.61 (*)    Hemoglobin 10.9 (*)    HCT 32.2 (*)    All other components within normal limits  COMPREHENSIVE METABOLIC PANEL - Abnormal; Notable for  the following components:   Potassium 3.4 (*)    Albumin 2.9 (*)    ALT 10 (*)    Total Bilirubin 0.2 (*)    All other components within normal limits  URINALYSIS, ROUTINE W REFLEX MICROSCOPIC    EKG  EKG Interpretation None       Radiology No results found.  Procedures Procedures (including critical care time)  Medications Ordered in ED Medications  sodium chloride 0.9 % bolus 1,000 mL (0 mLs Intravenous Stopped 06/09/17 0122)     Initial Impression / Assessment and Plan / ED Course  I have reviewed the triage vital signs and the nursing notes.  Pertinent labs  results that were available during my care of the patient were reviewed by me and considered in my medical decision making (see chart for details).     11:51 PM Fetal heart tones in the 130s. A call has been placed to her OB/GYN.  Patient is on fetal monitoring and is being monitored [facility] She has no active pain at this time 12:12 AM D/w dr Alfredo Battymattern with OBGYN  We discussed case Plan is to give IV fluids and continue to monitor for another 2 hours, ensure there is no contractions and if no further pain episodes, plan to d/c home 2:19 AM Labs are reassuring, no distress noted.  Patient has had fetal monitoring [facility], no acute issues and may be taken off monitor  Patient is well-appearing.  Advised her to call her OB/GYN later this morning.  We discussed strict ER return precautions Final Clinical Impressions(s) / ED Diagnoses   Final diagnoses:  Vomiting and diarrhea  Generalized abdominal pain    ED Discharge Orders    None       Zadie RhineWickline, Almyra Birman, MD 06/09/17 [phone]

## 2017-06-08 NOTE — ED Triage Notes (Signed)
Pt is [redacted] wks pregnant G4P1. C/o sharp abd pains that are intermittent x ~3 days. Last pain felt ~ 1 hour ago. 1 episode of vomiting and diarrhea this morning, none since. Pt states at her last OB visit OB told her he could feel baby's head and that she was dilated but did not tell her how far. Denies fever, vaginal dc or bleeding.

## 2017-06-09 ENCOUNTER — Encounter (HOSPITAL_BASED_OUTPATIENT_CLINIC_OR_DEPARTMENT_OTHER): Payer: Self-pay | Admitting: *Deleted

## 2017-06-09 LAB — COMPREHENSIVE METABOLIC PANEL
ALT: 10 U/L — ABNORMAL LOW (ref 14–54)
AST: 21 U/L (ref 15–41)
Albumin: 2.9 g/dL — ABNORMAL LOW (ref 3.5–5.0)
Alkaline Phosphatase: 86 U/L (ref 38–126)
Anion gap: 7 (ref 5–15)
BUN: 8 mg/dL (ref 6–20)
CHLORIDE: 106 mmol/L (ref 101–111)
CO2: 22 mmol/L (ref 22–32)
Calcium: 9.1 mg/dL (ref 8.9–10.3)
Creatinine, Ser: 0.63 mg/dL (ref 0.44–1.00)
GFR calc non Af Amer: >60 mL/min (ref >60)
Glucose, Bld: 85 mg/dL (ref 65–99)
POTASSIUM: 3.4 mmol/L — AB (ref 3.5–5.1)
Sodium: 135 mmol/L (ref 135–145)
Total Bilirubin: 0.2 mg/dL — ABNORMAL LOW (ref 0.3–1.2)
Total Protein: 6.5 g/dL (ref 6.5–8.1)

## 2017-06-09 LAB — CBC
HCT: 32.2 % — ABNORMAL LOW (ref 36.0–46.0)
Hemoglobin: 10.9 g/dL — ABNORMAL LOW (ref 12.0–15.0)
MCH: 30.2 pg (ref 26.0–34.0)
MCHC: 33.9 g/dL (ref 30.0–36.0)
MCV: 89.2 fL (ref 78.0–100.0)
PLATELETS: 300 10*3/uL (ref 150–400)
RBC: 3.61 MIL/uL — ABNORMAL LOW (ref 3.87–5.11)
RDW: 13.9 % (ref 11.5–15.5)
WBC: 8.1 10*3/uL (ref 4.0–10.5)

## 2017-06-09 LAB — URINALYSIS, ROUTINE W REFLEX MICROSCOPIC
BILIRUBIN URINE: NEGATIVE
Glucose, UA: NEGATIVE mg/dL
HGB URINE DIPSTICK: NEGATIVE
Ketones, ur: NEGATIVE mg/dL
Leukocytes, UA: NEGATIVE
Nitrite: NEGATIVE
PH: 5.5 (ref 5.0–8.0)
Protein, ur: NEGATIVE mg/dL
Specific Gravity, Urine: 1.015 (ref 1.005–1.030)

## 2017-06-09 NOTE — ED Notes (Signed)
[facility] called .... Pt. Is able to come [facility].  The baby and Pt. Are doing fine.

## 2017-06-09 NOTE — ED Notes (Signed)
[facility] to inquire about Pt. And give update on her end.  Pt./baby in no distress.  Pt. To have lab work and monitored till done.

## 2017-06-09 NOTE — ED Notes (Signed)
Reassessed pt. And left call bell at bedside with pt. Aware of how to reach RN.   VSS.

## 2017-06-09 NOTE — ED Notes (Signed)
Pt. Tolerating fluids and [facility] monitor in place.  Pt. In no distress.  Pt. On cell phone with her mother checking on her son.  No distress noted in Pt.

## 2017-06-09 NOTE — Discharge Instructions (Signed)
Please call your OB/GYN Dr. Allena KatzPatel today Return to the ER for any vaginal bleeding, severe abdominal pain, or loss of fluid from the vagina over the next 24-48 hours Be sure to stay hydrated

## 2017-06-09 NOTE — Progress Notes (Signed)
[facility] called about patient who [facility] with complaints of sharp intermittent abdominal pain x3 days; patient is a G3P1 at [redacted] weeks along in her pregnancy at this time; patient [facility]; EFM applied and assessing at this time; patient stated to ED RN that she was dilated some at previous prenatal appointment but physician did not give her a number; ED provider, Dr Bebe ShaggyWickline, assessed SVE and stated patient was 0.5cm; Dr Vergie Livingpickens called and notified of patient arrival and complaint and orders given for patient to have a CMP and CBC; RN stated that patient is denying pain at 0055 and EFM reactive with few contractions; if CMP and CBC normal patient may be discharged home with labor signs and symptoms

## 2017-09-28 ENCOUNTER — Encounter (HOSPITAL_COMMUNITY): Payer: Self-pay

## 2021-01-29 ENCOUNTER — Encounter (HOSPITAL_BASED_OUTPATIENT_CLINIC_OR_DEPARTMENT_OTHER): Payer: Self-pay | Admitting: Emergency Medicine

## 2021-01-29 ENCOUNTER — Other Ambulatory Visit: Payer: Self-pay

## 2021-01-29 ENCOUNTER — Emergency Department (HOSPITAL_BASED_OUTPATIENT_CLINIC_OR_DEPARTMENT_OTHER)
Admission: EM | Admit: 2021-01-29 | Discharge: 2021-01-29 | Disposition: A | Discharge status: Home / Self Care | Payer: Medicaid Other | Attending: Emergency Medicine | Admitting: Emergency Medicine

## 2021-01-29 DIAGNOSIS — N390 Urinary tract infection, site not specified: Secondary | ICD-10-CM | POA: Insufficient documentation

## 2021-01-29 DIAGNOSIS — Z20822 Contact with and (suspected) exposure to covid-19: Secondary | ICD-10-CM | POA: Diagnosis not present

## 2021-01-29 DIAGNOSIS — R059 Cough, unspecified: Secondary | ICD-10-CM | POA: Diagnosis present

## 2021-01-29 DIAGNOSIS — Z87891 Personal history of nicotine dependence: Secondary | ICD-10-CM | POA: Insufficient documentation

## 2021-01-29 DIAGNOSIS — J069 Acute upper respiratory infection, unspecified: Secondary | ICD-10-CM | POA: Diagnosis not present

## 2021-01-29 LAB — SARS CORONAVIRUS 2 (TAT 6-24 HRS): SARS Coronavirus 2: NEGATIVE

## 2021-01-29 LAB — URINALYSIS, ROUTINE W REFLEX MICROSCOPIC
Bilirubin Urine: NEGATIVE
Glucose, UA: NEGATIVE mg/dL
Hgb urine dipstick: NEGATIVE
Ketones, ur: NEGATIVE mg/dL
Nitrite: NEGATIVE
Protein, ur: NEGATIVE mg/dL
Specific Gravity, Urine: 1.025 (ref 1.005–1.030)
pH: 6 (ref 5.0–8.0)

## 2021-01-29 LAB — URINALYSIS, MICROSCOPIC (REFLEX)

## 2021-01-29 LAB — PREGNANCY, URINE: Preg Test, Ur: NEGATIVE

## 2021-01-29 MED ORDER — ACETAMINOPHEN 500 MG PO TABS
1000.0000 mg | ORAL_TABLET | Freq: Once | ORAL | Status: AC
  Administered 2021-01-29: 1000 mg via ORAL
  Filled 2021-01-29: qty 2

## 2021-01-29 MED ORDER — NITROFURANTOIN MONOHYD MACRO 100 MG PO CAPS: 100.0000 mg | ORAL_CAPSULE | Freq: Two times a day (BID) | ORAL | 0 refills | Status: AC

## 2021-01-29 NOTE — Discharge Instructions (Addendum)
If you develop high fever, severe cough or cough with blood, trouble breathing, severe headache, neck pain/stiffness, vomiting, or any other new/concerning symptoms then return to the ER for evaluation  

## 2021-01-29 NOTE — ED Triage Notes (Signed)
Pt reports cough, body aches, sore throat for last several days. Nad noted. Pt reports increased fatigue as well.

## 2021-01-29 NOTE — ED Provider Notes (Signed)
[facility] EMERGENCY DEPARTMENT Provider Note   CSN: [id number] Arrival date & time: 01/29/21  5284     History Chief Complaint  Patient presents with   Cough    Patricia Norton is a 30 y.o. female.  HPI 30 year old female presents with cough as well as back/abdominal pain.  All the symptoms started on 9/16.  A couple days prior her family was visited by another family and their child had a cough.  She is here with 2 of her kids who have similar cough.  She states she has had some back pain as well as some intermittent lower abdominal discomfort for these past 4 days.  Seems to come and go.  Pain is rated as an 8 though she also has not take anything.  No fevers.  She is having a sore throat and congestion and some body aches.  No dysuria/hematuria.  No vaginal discharge or bleeding or concern for STI.  She had some diarrhea this morning.  Past Medical History:  Diagnosis Date   Sickle cell trait (HCC)     There are no problems to display for this patient.   No past surgical history on file.   OB History     Gravida  3   Para      Term      Preterm      AB  1   Living  1      SAB  1   IAB      Ectopic      Multiple      Live Births  1           No family history on file.  Social History   Tobacco Use   Smoking status: Former   Smokeless tobacco: Never  Substance Use Topics   Alcohol use: Yes    Comment: occ   Drug use: Yes    Home Medications Prior to Admission medications   Medication Sig Start Date End Date Taking? Authorizing Provider  nitrofurantoin, macrocrystal-monohydrate, (MACROBID) 100 MG capsule Take 1 capsule (100 mg total) by mouth 2 (two) times daily. 01/29/21  Yes Pricilla Loveless, MD  neomycin-polymyxin-hydrocortisone (CORTISPORIN) 3.5-10000-1 OTIC suspension Place 3 drops into the left ear 4 (four) times daily. 02/13/17   Maczis, Elmer Sow, PA-C  promethazine (PHENERGAN) 25 MG tablet Take 1 tablet (25 mg total) by  mouth every 6 (six) hours as needed for nausea or vomiting. 11/17/16   Rolan Bucco, MD    Allergies    Penicillins  Review of Systems   Review of Systems  Constitutional:  Negative for fever.  HENT:  Positive for congestion and sore throat.   Respiratory:  Positive for cough. Negative for shortness of breath.   Gastrointestinal:  Positive for abdominal pain. Negative for vomiting.  Genitourinary:  Negative for dysuria, hematuria, vaginal bleeding and vaginal discharge.  Musculoskeletal:  Positive for back pain.  All other systems reviewed and are negative.  Physical Exam Updated Vital Signs BP 127/88   Pulse 87   Temp 98.4 F (36.9 C) (Oral)   Resp 20   Ht 5\' 6"  (1.676 m)   Wt 71 kg   LMP  (LMP Unknown) Comment: irregular; depo shot for prevention x3 months ago  SpO2 100%   Breastfeeding No   BMI 25.26 kg/m   Physical Exam Vitals and nursing note reviewed.  Constitutional:      General: She is not in acute distress.    Appearance: She  is well-developed. She is not ill-appearing or diaphoretic.  HENT:     Head: Normocephalic and atraumatic.     Right Ear: External ear normal.     Left Ear: External ear normal.     Nose: Nose normal.  Eyes:     General:        Right eye: No discharge.        Left eye: No discharge.  Cardiovascular:     Rate and Rhythm: Normal rate and regular rhythm.     Heart sounds: Normal heart sounds.  Pulmonary:     Effort: Pulmonary effort is normal.     Breath sounds: Normal breath sounds. No wheezing.  Abdominal:     Palpations: Abdomen is soft.     Tenderness: There is no abdominal tenderness.     Comments: No reproducible abdominal tenderness.  Multiple palpations in right lower quadrant with no tenderness  Musculoskeletal:     Comments: Diffuse and nonfocal thoracolumbar tenderness throughout her back.  Skin:    General: Skin is warm and dry.  Neurological:     Mental Status: She is alert.  Psychiatric:        Mood and Affect:  Mood is not anxious.    ED Results / Procedures / Treatments   Labs (all labs ordered are listed, but only abnormal results are displayed) Labs Reviewed  URINALYSIS, ROUTINE W REFLEX MICROSCOPIC - Abnormal; Notable for the following components:      Result Value   APPearance HAZY (*)    Leukocytes,Ua SMALL (*)    All other components within normal limits  URINALYSIS, MICROSCOPIC (REFLEX) - Abnormal; Notable for the following components:   Bacteria, UA RARE (*)    All other components within normal limits  SARS CORONAVIRUS 2 (TAT 6-24 HRS)  PREGNANCY, URINE    EKG None  Radiology No results found.  Procedures Procedures   Medications Ordered in ED Medications  acetaminophen (TYLENOL) tablet 1,000 mg (1,000 mg Oral Given 01/29/21 2094)    ED Course  I have reviewed the triage vital signs and the nursing notes.  Pertinent labs & imaging results that were available during my care of the patient were reviewed by me and considered in my medical decision making (see chart for details).    MDM Rules/Calculators/A&P                           Patient is well-appearing.  She has a benign abdominal exam with no reproducible tenderness.  However given her back/abdominal complaints with a questionable urinalysis I discussed options with her and she think she probably does have a UTI and would like to be treated.  We will give Macrobid.  Otherwise her 2 kids have tested positive for RSV and so she is likely dealing with a viral illness from this.  I do not think a CT or chest x-ray or labs are warranted.  Discharged home with return precautions. Final Clinical Impression(s) / ED Diagnoses Final diagnoses:  Acute upper respiratory infection  Acute urinary tract infection    Rx / DC Orders ED Discharge Orders          Ordered    nitrofurantoin, macrocrystal-monohydrate, (MACROBID) 100 MG capsule  2 times daily        01/29/21 7096             Pricilla Loveless, MD 01/29/21  [id number]

## 2021-01-29 NOTE — ED Notes (Signed)
ED Provider at bedside. Goldston MD. 

## 2022-02-11 ENCOUNTER — Emergency Department (HOSPITAL_BASED_OUTPATIENT_CLINIC_OR_DEPARTMENT_OTHER)
Admission: EM | Admit: 2022-02-11 | Discharge: 2022-02-11 | Disposition: A | Discharge status: Home / Self Care | Payer: Medicaid Other | Attending: Emergency Medicine | Admitting: Emergency Medicine

## 2022-02-11 ENCOUNTER — Encounter (HOSPITAL_BASED_OUTPATIENT_CLINIC_OR_DEPARTMENT_OTHER): Payer: Self-pay

## 2022-02-11 ENCOUNTER — Other Ambulatory Visit (HOSPITAL_BASED_OUTPATIENT_CLINIC_OR_DEPARTMENT_OTHER): Payer: Self-pay

## 2022-02-11 DIAGNOSIS — R21 Rash and other nonspecific skin eruption: Secondary | ICD-10-CM | POA: Diagnosis present

## 2022-02-11 DIAGNOSIS — L509 Urticaria, unspecified: Secondary | ICD-10-CM | POA: Diagnosis not present

## 2022-02-11 MED ORDER — HYDROMORPHONE HCL 1 MG/ML IJ SOLN: 1.0000 mg | Freq: Once | INTRAMUSCULAR | Status: DC

## 2022-02-11 MED ORDER — FAMOTIDINE 20 MG PO TABS
20.0000 mg | ORAL_TABLET | Freq: Once | ORAL | Status: AC
  Administered 2022-02-11: 20 mg via ORAL
  Filled 2022-02-11: qty 1

## 2022-02-11 MED ORDER — PREDNISONE 50 MG PO TABS
60.0000 mg | ORAL_TABLET | Freq: Once | ORAL | Status: AC
  Administered 2022-02-11: 60 mg via ORAL
  Filled 2022-02-11: qty 1

## 2022-02-11 MED ORDER — PREDNISONE 20 MG PO TABS
40.0000 mg | ORAL_TABLET | Freq: Every day | ORAL | 0 refills | Status: AC
  Filled 2022-02-11: qty 6, 3d supply, fill #0

## 2022-02-11 MED ORDER — SODIUM CHLORIDE 0.9 % IV SOLN
12.5000 mg | Freq: Four times a day (QID) | INTRAVENOUS | Status: DC | PRN
  Filled 2022-02-11: qty 0.5

## 2022-02-11 NOTE — ED Triage Notes (Addendum)
C/o generalized rash since last night. Itching. Denies trouble swallowing/breathing.  Bottom lip swollen, c/o sore throat.

## 2022-02-11 NOTE — Discharge Instructions (Addendum)
Continue 25 mg of Benadryl every 6-8 hours as needed.  Take next dose of steroid tomorrow.

## 2022-02-11 NOTE — ED Provider Notes (Signed)
[facility] EMERGENCY DEPARTMENT Provider Note   CSN: [id number] Arrival date & time: 02/11/22  1245     History  Chief Complaint  Patient presents with   Rash    Patricia Norton is a 31 y.o. female.  Patient here with rash.  Rash started 2 days ago to her lower legs now on her upper arms, neck and chest.  Denies any issues swallowing or breathing.  She has a little bit of swelling to her lower lip.  She is not on any new medications.  Does not take lisinopril.  No known allergies except to penicillin in the past.  Denies any nausea or vomiting or shortness of breath or chest pain difficulty breathing.  The history is provided by the patient.       Home Medications Prior to Admission medications   Medication Sig Start Date End Date Taking? Authorizing Provider  predniSONE (DELTASONE) 20 MG tablet Take 2 tablets (40 mg total) by mouth daily for 3 days. 02/11/22 02/14/22 Yes Zakayla Martinec, DO  neomycin-polymyxin-hydrocortisone (CORTISPORIN) 3.5-10000-1 OTIC suspension Place 3 drops into the left ear 4 (four) times daily. 02/13/17   Maczis, Barth Kirks, PA-C  nitrofurantoin, macrocrystal-monohydrate, (MACROBID) 100 MG capsule Take 1 capsule (100 mg total) by mouth 2 (two) times daily. 01/29/21   Sherwood Gambler, MD  promethazine (PHENERGAN) 25 MG tablet Take 1 tablet (25 mg total) by mouth every 6 (six) hours as needed for nausea or vomiting. 11/17/16   Malvin Johns, MD      Allergies    Penicillins    Review of Systems   Review of Systems  Physical Exam Updated Vital Signs BP (!) 119/93   Pulse 64   Temp 98.7 F (37.1 C) (Oral)   Resp 18   Ht 5\' 4"  (1.626 m)   Wt 77.1 kg   SpO2 100%   BMI 29.18 kg/m  Physical Exam Vitals and nursing note reviewed.  Constitutional:      General: She is not in acute distress.    Appearance: She is well-developed. She is not ill-appearing.  HENT:     Head: Normocephalic and atraumatic.     Right Ear: Tympanic membrane  normal.     Nose: Nose normal.     Mouth/Throat:     Mouth: Mucous membranes are moist.     Pharynx: No oropharyngeal exudate or posterior oropharyngeal erythema.     Comments: Very trace swelling to the right lower lip on the lateral portion, oropharynx is clear otherwise, soft palate unremarkable, no submandibular swelling, no stridor, no trismus Eyes:     Extraocular Movements: Extraocular movements intact.     Conjunctiva/sclera: Conjunctivae normal.     Pupils: Pupils are equal, round, and reactive to light.  Cardiovascular:     Rate and Rhythm: Normal rate and regular rhythm.     Pulses: Normal pulses.     Heart sounds: Normal heart sounds. No murmur heard. Pulmonary:     Effort: Pulmonary effort is normal. No respiratory distress.     Breath sounds: Normal breath sounds. No stridor. No wheezing.  Abdominal:     Palpations: Abdomen is soft.     Tenderness: There is no abdominal tenderness.  Musculoskeletal:        General: No swelling.     Cervical back: Normal range of motion and neck supple.  Skin:    General: Skin is warm and dry.     Capillary Refill: Capillary refill takes less than 2 seconds.  Findings: Rash present.     Comments: Diffuse hives on arms and legs worse in the inner thighs  Neurological:     Mental Status: She is alert.  Psychiatric:        Mood and Affect: Mood normal.     ED Results / Procedures / Treatments   Labs (all labs ordered are listed, but only abnormal results are displayed) Labs Reviewed - No data to display  EKG None  Radiology No results found.  Procedures Procedures    Medications Ordered in ED Medications  predniSONE (DELTASONE) tablet 60 mg (60 mg Oral Given 02/11/22 1133)  famotidine (PEPCID) tablet 20 mg (20 mg Oral Given 02/11/22 1134)    ED Course/ Medical Decision Making/ A&P                           Medical Decision Making Risk Prescription drug management.   Patricia Norton is here with rash.  Normal  vitals.  No fever.  No significant medical history.  Differential diagnosis is likely idiopathic hives, could be allergic reaction but no obvious source.  No signs of anaphylaxis on exam.  Hives started mostly in her inner thighs last night and woke up this morning with hives on her arms, little bit of lower lip swelling on the right lateral portion.  She has no shortness of breath or difficulty breathing.  No GI symptoms.  Overall fairly significant allergic reaction I suspect but do not think she meets anaphylaxis criteria at this time.  We will give her a dose of prednisone and Pepcid and reevaluate.  Patient reevaluated, itching has greatly improved.  Parts of the rash also improving.  Will prescribe prednisone.  Continue taking Benadryl at home.  We will will refer her to allergy.  Understands return precautions.  This chart was dictated using voice recognition software.  Despite best efforts to proofread,  errors can occur which can change the documentation meaning.         Final Clinical Impression(s) / ED Diagnoses Final diagnoses:  Rash    Rx / DC Orders ED Discharge Orders          Ordered    predniSONE (DELTASONE) 20 MG tablet  Daily        02/11/22 1158              Lashawn Bromwell, Madelaine Bhat, DO 02/11/22 1159
